# Patient Record
Sex: Female | Born: 1995 | Race: White | Hispanic: No | Marital: Single | Smoking: Current every day smoker
Health system: Southern US, Community
[De-identification: ages and names within clinical notes are randomized; demographics above are authoritative.]

## PROBLEM LIST (undated history)

## (undated) DIAGNOSIS — J45909 Unspecified asthma, uncomplicated: Secondary | ICD-10-CM

## (undated) DIAGNOSIS — F32A Depression, unspecified: Secondary | ICD-10-CM

## (undated) DIAGNOSIS — F419 Anxiety disorder, unspecified: Secondary | ICD-10-CM

## (undated) DIAGNOSIS — F329 Major depressive disorder, single episode, unspecified: Secondary | ICD-10-CM

## (undated) HISTORY — DX: Unspecified asthma, uncomplicated: J45.909

## (undated) HISTORY — DX: Depression, unspecified: F32.A

## (undated) HISTORY — DX: Anxiety disorder, unspecified: F41.9

## (undated) HISTORY — DX: Major depressive disorder, single episode, unspecified: F32.9

---
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
## 2015-01-01 ENCOUNTER — Other Ambulatory Visit (HOSPITAL_COMMUNITY): Payer: Self-pay | Admitting: Psychiatry

## 2015-01-02 ENCOUNTER — Ambulatory Visit (INDEPENDENT_AMBULATORY_CARE_PROVIDER_SITE_OTHER): Payer: Medicaid Other | Admitting: Psychiatry

## 2015-01-02 ENCOUNTER — Encounter (HOSPITAL_COMMUNITY): Payer: Self-pay | Admitting: Psychiatry

## 2015-01-02 VITALS — BP 128/70 | HR 79 | Ht 59.0 in | Wt 184.0 lb

## 2015-01-02 DIAGNOSIS — F1020 Alcohol dependence, uncomplicated: Secondary | ICD-10-CM | POA: Diagnosis not present

## 2015-01-02 DIAGNOSIS — F410 Panic disorder [episodic paroxysmal anxiety] without agoraphobia: Secondary | ICD-10-CM | POA: Diagnosis not present

## 2015-01-02 DIAGNOSIS — F333 Major depressive disorder, recurrent, severe with psychotic symptoms: Secondary | ICD-10-CM | POA: Diagnosis not present

## 2015-01-02 MED ORDER — FLUOXETINE HCL 10 MG PO TABS: 30.0000 mg | ORAL_TABLET | Freq: Every day | ORAL | Status: DC
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
## 2015-01-09 ENCOUNTER — Telehealth (HOSPITAL_COMMUNITY): Payer: Self-pay | Admitting: *Deleted

## 2015-01-09 NOTE — Telephone Encounter (Signed)
Prior authorization for fluoxetine received. Called [phone] spoke with Florentina AddisonKatie who states it will be sent for review due to not trying and failing 2 preferred drugs. However if the patient would be switched to capsules instead of tablets then prior authorization would not be required or needed. Decision will be made within 24 hours and faxed to office. PA #[id number].

## 2015-01-10 NOTE — Telephone Encounter (Signed)
[facility] called to check on prior authorization, they would like someone to call them back at [phone]

## 2015-01-11 NOTE — Telephone Encounter (Signed)
Contact [employer] at [phone] to check the status of PA [id number]. Per Amy, PA is approved from 01/09/2015-01/04/2016. [facility] and informed [facility] of PA approval. Per pharmacy, pt will be notify once prescription is ready.

## 2015-03-25 ENCOUNTER — Ambulatory Visit (HOSPITAL_COMMUNITY): Payer: Self-pay | Admitting: Psychiatry

## 2015-04-05 ENCOUNTER — Ambulatory Visit (HOSPITAL_COMMUNITY): Payer: Self-pay | Admitting: Psychiatry

## 2015-04-11 ENCOUNTER — Encounter (HOSPITAL_COMMUNITY): Payer: Self-pay | Admitting: Psychiatry

## 2015-04-11 ENCOUNTER — Ambulatory Visit (INDEPENDENT_AMBULATORY_CARE_PROVIDER_SITE_OTHER): Payer: Medicaid Other | Admitting: Psychiatry

## 2015-04-11 DIAGNOSIS — F1020 Alcohol dependence, uncomplicated: Secondary | ICD-10-CM | POA: Diagnosis not present

## 2015-04-11 DIAGNOSIS — F333 Major depressive disorder, recurrent, severe with psychotic symptoms: Secondary | ICD-10-CM

## 2015-04-11 DIAGNOSIS — F410 Panic disorder [episodic paroxysmal anxiety] without agoraphobia: Secondary | ICD-10-CM | POA: Diagnosis not present

## 2015-04-11 MED ORDER — FLUOXETINE HCL 10 MG PO TABS: 30.0000 mg | ORAL_TABLET | Freq: Every day | ORAL | Status: DC
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
## 2015-04-13 ENCOUNTER — Other Ambulatory Visit (HOSPITAL_COMMUNITY): Payer: Self-pay | Admitting: Psychiatry

## 2015-04-19 NOTE — Telephone Encounter (Signed)
Received medication request from [facility] for Prozac 10mg . Per Dr. Gilmore LarocheAkhtar, medication request is denied. Pt was sent to pharmacy on 04/11/15 w/ 2 refills. Pt is schedule for a f/u appt on 08/06/15.

## 2015-08-06 ENCOUNTER — Ambulatory Visit (HOSPITAL_COMMUNITY): Payer: Self-pay | Admitting: Psychiatry

## 2015-08-20 ENCOUNTER — Other Ambulatory Visit (HOSPITAL_COMMUNITY): Payer: Self-pay | Admitting: Psychiatry

## 2015-08-22 ENCOUNTER — Telehealth (HOSPITAL_COMMUNITY): Payer: Self-pay | Admitting: Psychiatry

## 2015-08-23 NOTE — Telephone Encounter (Signed)
Pt called and stated she will run out of meds on Saturday. Pt needs refills. Informed pt that dr is out of office until Tuesday. If she needs the meds to go to urgent care. We will call her on Monday.

## 2015-08-26 MED ORDER — FLUOXETINE HCL 10 MG PO TABS: 30.0000 mg | ORAL_TABLET | Freq: Every day | ORAL | Status: DC

## 2015-08-26 NOTE — Telephone Encounter (Signed)
Pt phone in to office for a refill for Prozac. Per Dr. Akhtar, refill request for Prozac 30mg, #90 is authorize. Refill was escribed to [facility]. Called and informed pt of refill status. Pt f/u appt is schedule on 09/13/15. Pt verbalizes understanding.  

## 2015-08-26 NOTE — Telephone Encounter (Signed)
Pt phone in to office for a refill for Prozac. Per Dr. Gilmore LarocheAkhtar, refill request for Prozac 30mg , #90 is authorize. Refill was escribed to [facility]. Called and informed pt of refill status. Pt f/u appt is schedule on 09/13/15. Pt verbalizes understanding.

## 2015-08-27 ENCOUNTER — Ambulatory Visit (HOSPITAL_COMMUNITY): Payer: Self-pay | Admitting: Psychiatry

## 2015-09-13 ENCOUNTER — Encounter (HOSPITAL_COMMUNITY): Payer: Self-pay | Admitting: Psychiatry

## 2015-09-13 ENCOUNTER — Ambulatory Visit (INDEPENDENT_AMBULATORY_CARE_PROVIDER_SITE_OTHER): Payer: Medicaid Other | Admitting: Psychiatry

## 2015-09-13 DIAGNOSIS — F410 Panic disorder [episodic paroxysmal anxiety] without agoraphobia: Secondary | ICD-10-CM | POA: Diagnosis not present

## 2015-09-13 DIAGNOSIS — F1020 Alcohol dependence, uncomplicated: Secondary | ICD-10-CM

## 2015-09-13 DIAGNOSIS — F333 Major depressive disorder, recurrent, severe with psychotic symptoms: Secondary | ICD-10-CM

## 2015-09-13 MED ORDER — FLUOXETINE HCL 10 MG PO TABS: 30.0000 mg | ORAL_TABLET | Freq: Every day | ORAL | 3 refills | Status: DC

## 2015-09-13 NOTE — Progress Notes (Signed)
Patient ID: Rhonda Weber, female   DOB: Jan 23, 1996, 20 y.o.   MRN: [id number]  [facility] Follow up outpatient visit   Rhonda Weber [id number] 19 y.o.  09/13/2015 8:57 AM  Chief Complaint:  Depression and anxiety  History of Present Illness:   Patient Presents for follow-up and medication management for major depressive disorder with psychotic features. Possible schizoaffective disorder depressed type or MDD. Generalized anxiety disorder. Rule out alcohol use disorder mild-to-moderate.  Zahler 20  years old currently single Caucasian transgender who wants to be called Rhonda Weber and not Rhonda Weber. She currently lives with her mom her sibling sister and her stepdad.   She mostly sits quiet and gives yes or no answer. Aggravating factor: people or conflicts Modifying factor: close friends Quick answers does not want to be interviewed or assessed. States that she is doing fine. Does not talk about alcohol or using drugs.  Got upset why do I have to come back and see you for medication As a provider and from writing of prescriptions he want to make follow-up appointments so that we can review medication concerns and side effects Anxiety: states i am fine Depression: says handling it ok. Not suicidal.   She does endorse having a difficult childhood  when she told that she is transgender it was difficult to deal     Hospitalization for psychiatric illness: Yes History of Electroconvulsive Shock Therapy: No Prior Suicide Attempts: Yes  Medical History; Past Medical History:  Diagnosis Date  . Anxiety   . Asthma   . Depression     Allergies: Allergies  Allergen Reactions  . Celexa [Citalopram Hydrobromide]   . Singulair [Montelukast]     hallucinations    Medications: Outpatient Encounter Prescriptions as of 09/13/2015  Medication Sig  . beclomethasone (QVAR) 40 MCG/ACT inhaler Inhale 2 puffs into the lungs 2 (two) times daily.  . fexofenadine-pseudoephedrine (ALLEGRA-D  ALLERGY & CONGESTION) 180-240 MG per 24 hr tablet Take 1 tablet by mouth daily.  Marland Kitchen FLUoxetine (PROZAC) 10 MG tablet Take 3 tablets (30 mg total) by mouth daily.  . montelukast (SINGULAIR) 10 MG tablet TAKE 1 TABLET (10 MG TOTAL) BY MOUTH AT BEDTIME.  Marland Kitchen omeprazole (PRILOSEC) 10 MG capsule TAKE 1 CAPSULE EVERY DAY  . omeprazole (PRILOSEC) 10 MG capsule TAKE 1 CAPSULE EVERY DAY  . QVAR 40 MCG/ACT inhaler 2 PUFFS TWICE A DAY  . VENTOLIN HFA 108 (90 BASE) MCG/ACT inhaler INHALE 2 PUFFS INTO THE LUNGS EVERY 6 (SIX) HOURS AS NEEDED.  . [DISCONTINUED] FLUoxetine (PROZAC) 10 MG tablet Take 3 tablets (30 mg total) by mouth daily.   No facility-administered encounter medications on file as of 09/13/2015.       Family History; Family History  Problem Relation Age of Onset  . Prostate cancer Maternal Grandfather   . Diabetes Maternal Aunt   . Parkinsonism Maternal Grandmother   . Colon cancer Paternal Grandfather      Labs:  No results found for this or any previous visit (from the past 2160 hour(s)).     Musculoskeletal: Strength & Muscle Tone: within normal limits Gait & Station: normal Patient leans: N/A  Mental Status Examination;   Psychiatric Specialty Exam: Physical Exam  Constitutional: She appears well-developed and well-nourished.    Review of Systems  Constitutional: Negative for fever.  Skin: Negative for rash.  Neurological: Negative for tremors.  Psychiatric/Behavioral: Positive for substance abuse. Negative for depression, hallucinations and suicidal ideas.    There were no vitals taken for this  visit.There is no height or weight on file to calculate BMI.  General Appearance: Casual  Eye Contact::  Fair  Speech:  Normal Rate  Volume:  Normal  Mood:  Denies dysphoria .   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 Rule out schizoaffective disorder depressed type. Generalized anxiety disorder. Alcohol abuse disorder moderate  Axis II: Deferred  Axis III:  Past Medical History:  Diagnosis Date  . Anxiety   . Asthma   . Depression     Axis IV: Psychosocial   Treatment Plan and Summary:   She has been compliant  to take Prozac on a regular basis. She does not want to come her regularly. Does not want to be asked questions either.  Has shown poor insight about alcohol use , did not elaborate how much is she drinking now. Says i am not drinking  and dont want to talk about alcohol use. She states to be doing baseline and not suicidal.  prozac  prescriptions sent.  She understands to be using alcohol or drugs and its effect on meds and judjement.  Psychosis: denies as of now.  Not interested in therapy with her counseling.  Pertinent Labs and Relevant Prior Notes reviewed. Medication Side effects, benefits and risks reviewed/discussed with Patient. Time given for patient to respond and asks questions regarding the Diagnosis and Medications. Safety concerns and to report to ER if suicidal or call 911.  Follow up with Primary care provider in regards to Medical conditions. Recommend compliance with medications and follow up office appointments.  Greater than 50% of time was spend in counseling and coordination of care with the patient. Patients Mom was involved in plan and collaboration of care and getting collaborative information and her status.  Schedule for Follow up visit in  4 months  or call in  earlier as necessary.  Time spent: 25 minutes   Rhonda Ross, MD 09/13/2015

## 2016-01-21 ENCOUNTER — Other Ambulatory Visit (HOSPITAL_COMMUNITY): Payer: Self-pay | Admitting: Psychiatry

## 2016-01-24 NOTE — Telephone Encounter (Signed)
Received fax from [facility] requesting a refill for Prozac. Per Dr. Gilmore LarocheAkhtar, refill is authorize for Prozac 10mg , #90. Rx was sent to pharmacy. lvm informing pt of refill status. Pt f/u apt is schedule on 12/20. PT verbalizes understanding.

## 2016-01-28 ENCOUNTER — Ambulatory Visit (HOSPITAL_COMMUNITY): Payer: Self-pay | Admitting: Psychiatry

## 2016-01-29 ENCOUNTER — Encounter (HOSPITAL_COMMUNITY): Payer: Self-pay | Admitting: Psychiatry

## 2016-01-29 ENCOUNTER — Ambulatory Visit (INDEPENDENT_AMBULATORY_CARE_PROVIDER_SITE_OTHER): Payer: Medicaid Other | Admitting: Psychiatry

## 2016-01-29 VITALS — BP 116/74 | HR 100 | Resp 16 | Ht 59.0 in | Wt 204.0 lb

## 2016-01-29 DIAGNOSIS — F333 Major depressive disorder, recurrent, severe with psychotic symptoms: Secondary | ICD-10-CM | POA: Diagnosis not present

## 2016-01-29 DIAGNOSIS — Z91018 Allergy to other foods: Secondary | ICD-10-CM

## 2016-01-29 DIAGNOSIS — F1020 Alcohol dependence, uncomplicated: Secondary | ICD-10-CM | POA: Diagnosis not present

## 2016-01-29 DIAGNOSIS — Z888 Allergy status to other drugs, medicaments and biological substances status: Secondary | ICD-10-CM

## 2016-01-29 DIAGNOSIS — Z800 Family history of malignant neoplasm of digestive organs: Secondary | ICD-10-CM | POA: Diagnosis not present

## 2016-01-29 DIAGNOSIS — F410 Panic disorder [episodic paroxysmal anxiety] without agoraphobia: Secondary | ICD-10-CM

## 2016-01-29 DIAGNOSIS — Z8042 Family history of malignant neoplasm of prostate: Secondary | ICD-10-CM | POA: Diagnosis not present

## 2016-01-29 DIAGNOSIS — Z833 Family history of diabetes mellitus: Secondary | ICD-10-CM

## 2016-01-29 MED ORDER — FLUOXETINE HCL 10 MG PO TABS: 30.0000 mg | ORAL_TABLET | Freq: Every day | ORAL | 5 refills | Status: AC

## 2016-01-29 NOTE — Progress Notes (Signed)
Patient ID: Otilio SaberSara Yardley, female   DOB: 02/22/1995, 20 y.o.   MRN: [id number]  [facility]   Otilio SaberSara Frost [id number] 20 y.o.  01/29/2016 2:12 PM  Chief Complaint:  Depression and anxiety  History of Present Illness:   Patient Presents for follow-up and medication management for major depressive disorder with psychotic features. Possible schizoaffective disorder depressed type or MDD. Generalized anxiety disorder. Rule out alcohol use disorder mild-to-moderate.  Sarahis a  transgender who wants to be called Clifton CustardAaron and not Maralyn SagoSarah. She currently lives with her mom her sibling sister and her stepdad.  She is having some cold symptoms for multiple sclerosis is that she will get a checkup by Dr. and does not want to talk much because it hurts overall she is doing reasonably regarding her anxiety and depression with the Prozac at dose of 30 mg Otherwise she remains mostly quiet and does not endorse any concern with the medication when she does take it she does okay there was a time when she was running when she started having panic attacks. She mostly sits quiet and gives yes or no answer. Aggravating factor: people or conflicts Modifying factor: close friends   Medical History; Past Medical History:  Diagnosis Date  . Anxiety   . Asthma   . Depression     Allergies: Allergies  Allergen Reactions  . Pineapple Swelling  . Celexa [Citalopram Hydrobromide]   . Singulair [Montelukast]     hallucinations    Medications: Outpatient Encounter Prescriptions as of 01/29/2016  Medication Sig  . beclomethasone (QVAR) 40 MCG/ACT inhaler Inhale 2 puffs into the lungs 2 (two) times daily.  . fexofenadine-pseudoephedrine (ALLEGRA-D ALLERGY & CONGESTION) 180-240 MG per 24 hr tablet Take 1 tablet by mouth daily.  Marland Kitchen. FLUoxetine (PROZAC) 10 MG tablet Take 3 tablets (30 mg total) by mouth daily.  . montelukast (SINGULAIR) 10 MG tablet TAKE 1 TABLET (10 MG TOTAL)  BY MOUTH AT BEDTIME.  Marland Kitchen. omeprazole (PRILOSEC) 10 MG capsule TAKE 1 CAPSULE EVERY DAY  . omeprazole (PRILOSEC) 10 MG capsule TAKE 1 CAPSULE EVERY DAY  . QVAR 40 MCG/ACT inhaler 2 PUFFS TWICE A DAY  . VENTOLIN HFA 108 (90 BASE) MCG/ACT inhaler INHALE 2 PUFFS INTO THE LUNGS EVERY 6 (SIX) HOURS AS NEEDED.  . [DISCONTINUED] FLUoxetine (PROZAC) 10 MG tablet TAKE 3 TABLETS (30 MG TOTAL) BY MOUTH DAILY.   No facility-administered encounter medications on file as of 01/29/2016.       Family History; Family History  Problem Relation Age of Onset  . Prostate cancer Maternal Grandfather   . Diabetes Maternal Aunt   . Parkinsonism Maternal Grandmother   . Colon cancer Paternal Grandfather      Labs:  No results found for this or any previous visit (from the past 2160 hour(s)).     Mental Status Examination;   Psychiatric Specialty Exam: Physical Exam  Constitutional: She is oriented to person, place, and time. She appears well-developed and well-nourished.  Neurological: She is alert and oriented to person, place, and time.    Review of Systems  Constitutional: Negative for fever.  Respiratory: Positive for cough.   Cardiovascular: Negative for chest pain.  Skin: Negative for rash.  Neurological: Negative for tremors.  Psychiatric/Behavioral: Negative for depression, hallucinations and suicidal ideas.    Blood pressure 116/74, pulse 100, resp. rate 16, height 4\' 11"  (1.499 m), weight 204 lb (92.5 kg), SpO2 99 %.Body mass index is 41.2 kg/m.  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 Rule out schizoaffective disorder depressed type. Generalized anxiety disorder. Alcohol abuse disorder moderate  Axis II: Deferred  Axis III:  Past Medical History:  Diagnosis Date  . Anxiety   . Asthma   . Depression     Axis IV: Psychosocial   Treatment Plan and Summary: MDD: continue prozac. Mild GAD: not worsened. Continue prozac Psychosis: denies as of now Prescriptions printed FU in 6 months Patient to report to ED or urgent care if her cough not gets better Denies alcohol use    Thresa RossAKHTAR, Eulanda Dorion, MD 01/29/2016

## 2016-07-21 ENCOUNTER — Ambulatory Visit (HOSPITAL_COMMUNITY): Payer: Self-pay | Admitting: Psychiatry
